# Patient Record
Sex: Male | Born: 1985 | Race: Black or African American | Hispanic: No | Marital: Married | Smoking: Never smoker
Health system: Southern US, Community
[De-identification: ages and names within clinical notes are randomized; demographics above are authoritative.]

---

## 2020-11-18 ENCOUNTER — Other Ambulatory Visit: Payer: Self-pay

## 2020-11-18 ENCOUNTER — Emergency Department
Admission: EM | Admit: 2020-11-18 | Discharge: 2020-11-19 | Disposition: A | Discharge status: Home / Self Care | Payer: Self-pay | Attending: Emergency Medicine | Admitting: Emergency Medicine

## 2020-11-18 ENCOUNTER — Encounter: Payer: Self-pay | Admitting: *Deleted

## 2020-11-18 DIAGNOSIS — N451 Epididymitis: Secondary | ICD-10-CM | POA: Insufficient documentation

## 2020-11-18 DIAGNOSIS — A549 Gonococcal infection, unspecified: Secondary | ICD-10-CM | POA: Insufficient documentation

## 2020-11-18 LAB — URINALYSIS, COMPLETE (UACMP) WITH MICROSCOPIC
Bacteria, UA: NONE SEEN
Bilirubin Urine: NEGATIVE
Glucose, UA: NEGATIVE mg/dL
Ketones, ur: NEGATIVE mg/dL
Nitrite: NEGATIVE
Protein, ur: 100 mg/dL — AB
RBC / HPF: >50 RBC/hpf — ABNORMAL HIGH (ref 0–5)
Specific Gravity, Urine: 1.02 (ref 1.005–1.030)
Squamous Epithelial / HPF: NONE SEEN (ref 0–5)
WBC, UA: >50 WBC/hpf — ABNORMAL HIGH (ref 0–5)
pH: 5 (ref 5.0–8.0)

## 2020-11-18 LAB — CHLAMYDIA/NGC RT PCR (ARMC ONLY)
Chlamydia Tr: NOT DETECTED
N gonorrhoeae: DETECTED — AB

## 2020-11-18 MED ORDER — CEFTRIAXONE SODIUM 1 G IJ SOLR
500.0000 mg | Freq: Once | INTRAMUSCULAR | Status: AC
  Administered 2020-11-19: 500 mg via INTRAMUSCULAR
  Filled 2020-11-18: qty 10

## 2020-11-18 NOTE — ED Provider Notes (Signed)
[facility] Emergency Department Provider Note  ____________________________________________   Event Date/Time   First MD Initiated Contact with Patient 11/18/20 2341     (approximate)  I have reviewed the triage vital signs and the nursing notes.   HISTORY  Chief Complaint Penile Discharge    HPI Eduardo Stephens is a 35 y.o. male with several days of penile discharge, dysuria, scrotal swelling and pain.  Sexually active with 1 male partner for the past 7 months.  Has had a history of STDs years ago.  No fevers, abdominal pain, nausea, vomiting or diarrhea.        History reviewed. No pertinent past medical history.  There are no problems to display for this patient.   History reviewed. No pertinent surgical history.  Prior to Admission medications   Medication Sig Start Date End Date Taking? Authorizing Provider  levofloxacin (LEVAQUIN) 500 MG tablet Take 1 tablet (500 mg total) by mouth daily for 10 days. 11/19/20 11/29/20 Yes Shiza Thelen, Layla Maw, DO    Allergies Patient has no known allergies.  History reviewed. No pertinent family history.  Social History Social History   Tobacco Use   Smoking status: Never   Smokeless tobacco: Never  Substance Use Topics   Alcohol use: Yes   Drug use: Not Currently    Review of Systems Constitutional: No fever. Eyes: No visual changes. ENT: No sore throat. Cardiovascular: Denies chest pain. Respiratory: Denies shortness of breath. Gastrointestinal: No nausea, vomiting, diarrhea. Genitourinary: + for dysuria. Musculoskeletal: Negative for back pain. Skin: Negative for rash. Neurological: Negative for focal weakness or numbness.  ____________________________________________   PHYSICAL EXAM:  VITAL SIGNS: ED Triage Vitals  Enc Vitals Group     BP 11/18/20 2021 (!) 146/88     Pulse Rate 11/18/20 2021 (!) 111     Resp 11/18/20 2021 18     Temp 11/18/20 2021 99.2 F (37.3 C)     Temp  Source 11/18/20 2021 Oral     SpO2 11/18/20 2021 96 %     Weight 11/18/20 2022 220 lb (99.8 kg)     Height 11/18/20 2022 6' (1.829 m)     Head Circumference --      Peak Flow --      Pain Score 11/18/20 2022 5     Pain Loc --      Pain Edu? --      Excl. in GC? --    CONSTITUTIONAL: Alert and oriented and responds appropriately to questions. Well-appearing; well-nourished HEAD: Normocephalic EYES: Conjunctivae clear, pupils appear equal, EOM appear intact ENT: normal nose; moist mucous membranes NECK: Supple, normal ROM CARD: Regular and tachycardic; S1 and S2 appreciated; no murmurs, no clicks, no rubs, no gallops RESP: Normal chest excursion without splinting or tachypnea; breath sounds clear and equal bilaterally; no wheezes, no rhonchi, no rales, no hypoxia or respiratory distress, speaking full sentences ABD/GI: Normal bowel sounds; non-distended; soft, non-tender, no rebound, no guarding, no peritoneal signs, no hepatosplenomegaly, multiple old surgical abdominal scars GU:  Normal external genitalia, circumcised male, normal penile shaft, no blood or discharge at the urethral meatus, no testicular masses but is tender to palpation bilaterally with swelling noted on the left side compared to the right, 2+ femoral pulses bilaterally; no perineal erythema, warmth, subcutaneous air or crepitus; no high riding testicle, normal bilateral cremasteric reflex.  Chaperone present for exam. BACK: The back appears normal EXT: Normal ROM in all joints; no deformity noted, no edema; no  cyanosis SKIN: Normal color for age and race; warm; no rash on exposed skin NEURO: Moves all extremities equally PSYCH: The patient's mood and manner are appropriate.  ____________________________________________   LABS (all labs ordered are listed, but only abnormal results are displayed)  Labs Reviewed  CHLAMYDIA/NGC RT PCR ([facility] ONLY)           - Abnormal; Notable for the following components:      Result  Value   N gonorrhoeae DETECTED (*)    All other components within normal limits  URINALYSIS, COMPLETE (UACMP) WITH MICROSCOPIC - Abnormal; Notable for the following components:   Color, Urine AMBER (*)    APPearance CLOUDY (*)    Hgb urine dipstick MODERATE (*)    Protein, ur 100 (*)    Leukocytes,Ua LARGE (*)    RBC / HPF >50 (*)    WBC, UA >50 (*)    All other components within normal limits  HIV ANTIBODY (ROUTINE TESTING W REFLEX)  RPR   ____________________________________________  EKG   ____________________________________________  RADIOLOGY I, Harika Laidlaw, personally viewed and evaluated these images (plain radiographs) as part of my medical decision making, as well as reviewing the written report by the radiologist.  ED MD interpretation: Ultrasound shows epididymitis.  Normal blood flow to both testicles.  Official radiology report(s): US SCROTUM W/DOPPLER  Result Date: 11/19/2020 CLINICAL DATA:  Scrotal swelling, testicular pain x3 days EXAM: SCROTAL ULTRASOUND DOPPLER ULTRASOUND OF THE TESTICLES TECHNIQUE: Complete ultrasound examination of the testicles, epididymis, and other scrotal structures was performed. Color and spectral Doppler ultrasound were also utilized to evaluate blood flow to the testicles. COMPARISON:  None. FINDINGS: Right testicle Measurements: 5.1 x 2.0 x 2.3 cm. No mass or microlithiasis visualized. Left testicle Measurements: 3.6 x 2.0 x 3.0 cm. No mass or microlithiasis visualized. Right epididymis:  Normal in size and appearance. Left epididymis:  Enlarged/hypervascular. Hydrocele:  Small left hydrocele. Varicocele:  Present on the left. Pulsed Doppler interrogation of both testes demonstrates normal low resistance arterial and venous waveforms bilaterally. IMPRESSION: Enlarged/hypervascular left epididymis, suggesting epididymitis. Reactive left hydrocele. Suspected left varicocele. Normal sonographic appearance of the bilateral testes. No evidence of  testicular torsion. Electronically Signed   By: Charline Bills M.D.   On: 11/19/2020 01:25    ____________________________________________   PROCEDURES  Procedure(s) performed (including Critical Care):  Procedures    ____________________________________________   INITIAL IMPRESSION / ASSESSMENT AND PLAN / ED COURSE  As part of my medical decision making, I reviewed the following data within the electronic MEDICAL RECORD NUMBER Nursing notes reviewed and incorporated, Labs reviewed , Radiograph reviewed , and Notes from prior ED visits         Patient here with dysuria, scrotal pain and swelling.  Urine shows large amount of white blood cells, red blood cells.  Positive for gonorrhea.  Will give Rocephin here.  He agrees to HIV, syphilis testing today.  Will obtain scrotal ultrasound as differential includes epididymitis, torsion, mass.  No sign of scrotal abscess, cellulitis, Fournier's gangrene.  Doubt kidney stone, appendicitis.  Patient declines pain medication at this time.  ED PROGRESS  Ultrasound shows epididymitis.  Normal blood flow to both testicles.  Has received 500 mg of IM Rocephin here.  We will also discharge with Levaquin for the next 10 days.  Recommended alternating Tylenol, Motrin for pain.  Recommended that he avoid any sexual intercourse until he has completed his treatment and his partner has been 7 days past treatment as well.  Patient comfortable with this plan.  Will discharge home.   At this time, I do not feel there is any life-threatening condition present. I have reviewed, interpreted and discussed all results (EKG, imaging, lab, urine as appropriate) and exam findings with patient/family. I have reviewed nursing notes and appropriate previous records.  I feel the patient is safe to be discharged home without further emergent workup and can continue workup as an outpatient as needed. Discussed usual and customary return precautions. Patient/family verbalize  understanding and are comfortable with this plan.  Outpatient follow-up has been provided as needed. All questions have been answered.  ____________________________________________   FINAL CLINICAL IMPRESSION(S) / ED DIAGNOSES  Final diagnoses:  Gonorrhea  Epididymitis     ED Discharge Orders          Ordered    levofloxacin (LEVAQUIN) 500 MG tablet  Daily        11/19/20 0144            *Please note:  Eduardo Stephens was evaluated in Emergency Department on 11/19/2020 for the symptoms described in the history of present illness. He was evaluated in the context of the global COVID-19 pandemic, which necessitated consideration that the patient might be at risk for infection with the SARS-CoV-2 virus that causes COVID-19. Institutional protocols and algorithms that pertain to the evaluation of patients at risk for COVID-19 are in a state of rapid change based on information released by regulatory bodies including the CDC and federal and state organizations. These policies and algorithms were followed during the patient's care in the ED.  Some ED evaluations and interventions may be delayed as a result of limited staffing during and the pandemic.*   Note:  This document was prepared using Dragon voice recognition software and may include unintentional dictation errors.    Roald Lukacs, Layla Maw, DO 11/19/20 [phone]

## 2020-11-18 NOTE — ED Triage Notes (Signed)
Pt has green penile discharge and burning with urination.  Pt also reports pain in testicles.  Pt alert.

## 2020-11-19 ENCOUNTER — Emergency Department: Payer: Self-pay

## 2020-11-19 LAB — RPR: RPR Ser Ql: NONREACTIVE

## 2020-11-19 LAB — HIV ANTIBODY (ROUTINE TESTING W REFLEX): HIV Screen 4th Generation wRfx: NONREACTIVE

## 2020-11-19 MED ORDER — LEVOFLOXACIN 500 MG PO TABS
500.0000 mg | ORAL_TABLET | Freq: Once | ORAL | Status: AC
  Administered 2020-11-19: 500 mg via ORAL
  Filled 2020-11-19: qty 1

## 2020-11-19 MED ORDER — LEVOFLOXACIN 500 MG PO TABS: 500.0000 mg | ORAL_TABLET | Freq: Every day | ORAL | 0 refills | Status: AC

## 2020-11-19 MED ORDER — IBUPROFEN 800 MG PO TABS
800.0000 mg | ORAL_TABLET | Freq: Once | ORAL | Status: AC
  Administered 2020-11-19: 800 mg via ORAL
  Filled 2020-11-19: qty 1

## 2020-11-19 NOTE — ED Notes (Signed)
ED Provider Ward at bedside. 

## 2020-11-19 NOTE — Discharge Instructions (Addendum)
You may alternate Tylenol 1000 mg every 6 hours as needed for pain, fever and Ibuprofen 800 mg every 8 hours as needed for pain, fever.  Please take Ibuprofen with food.  Do not take more than 4000 mg of Tylenol (acetaminophen) in a 24 hour period.  Steps to find a Primary Care Provider (PCP):  Call [phone] or [phone] to access "[facility] Find a Doctor Service."  2.  You may also go on the [facility] website at [email or web address]  Please avoid any sexual intercourse until you have completed your antibiotic therapy and at least 7 days after your partner has been treated as well.

## 2022-09-14 IMAGING — US US SCROTUM W/ DOPPLER COMPLETE
1 series · 14 of 25 positions shown · non-contrast
Comparison: None.

CLINICAL DATA: Scrotal swelling, testicular pain x3 days

EXAM:
SCROTAL ULTRASOUND
DOPPLER ULTRASOUND OF THE TESTICLES
TECHNIQUE: Complete ultrasound examination of the testicles, epididymis, and
other scrotal structures was performed. Color and spectral Doppler
ultrasound were also utilized to evaluate blood flow to the
testicles.

[Series 1: us scrotum w/doppler · 14 of 59 slices shown]
[im 1/59]
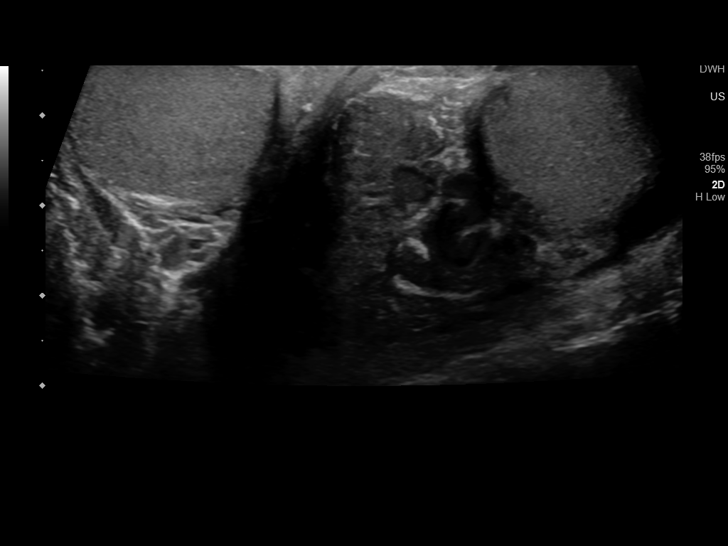
[im 5/59]
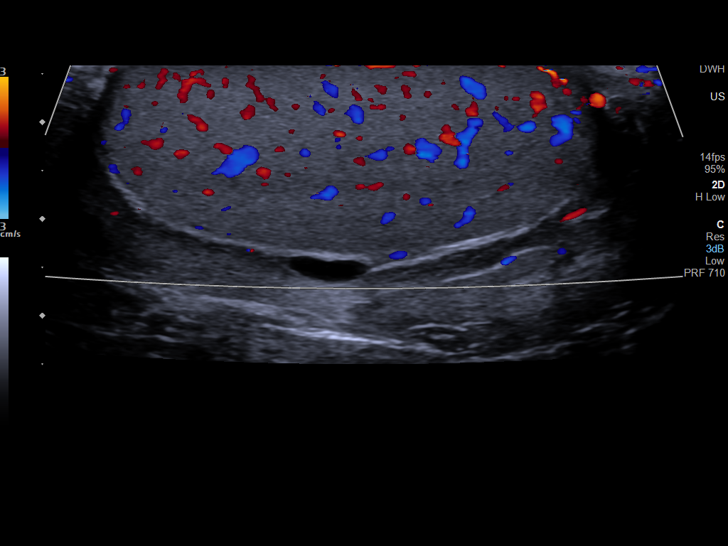
[im 10/59]
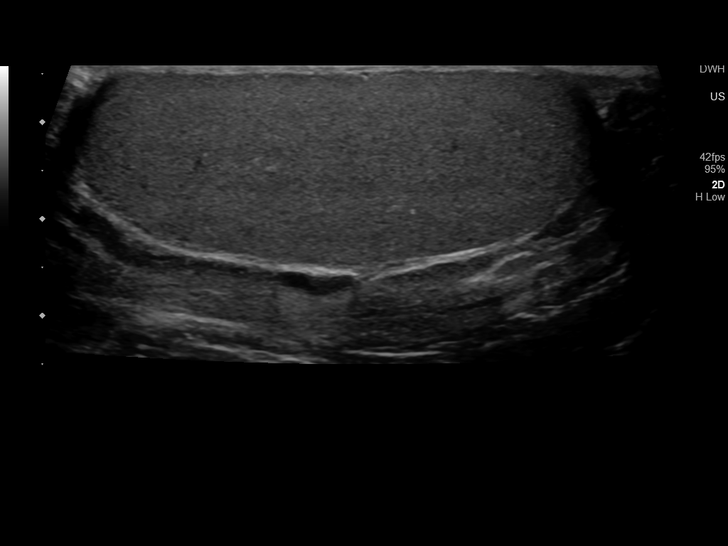
[im 15/59]
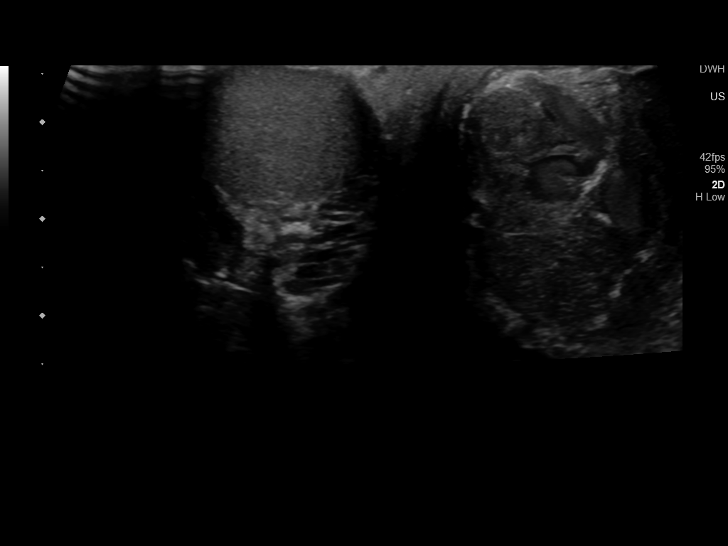
[im 20/59]
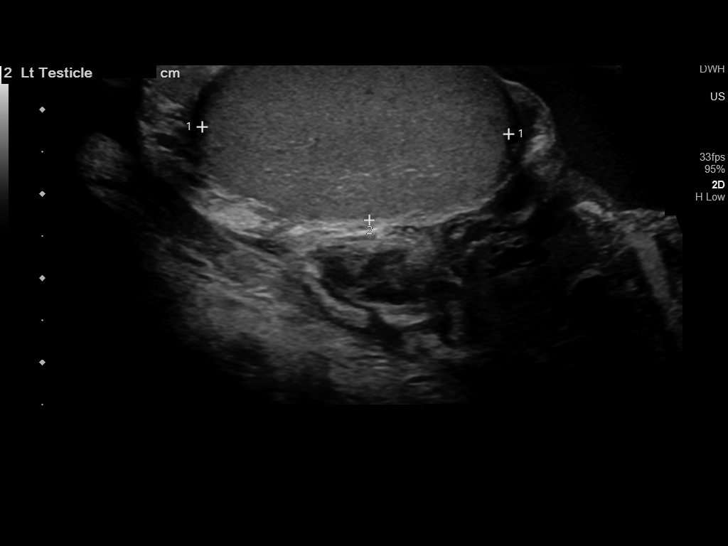
[im 22/59]
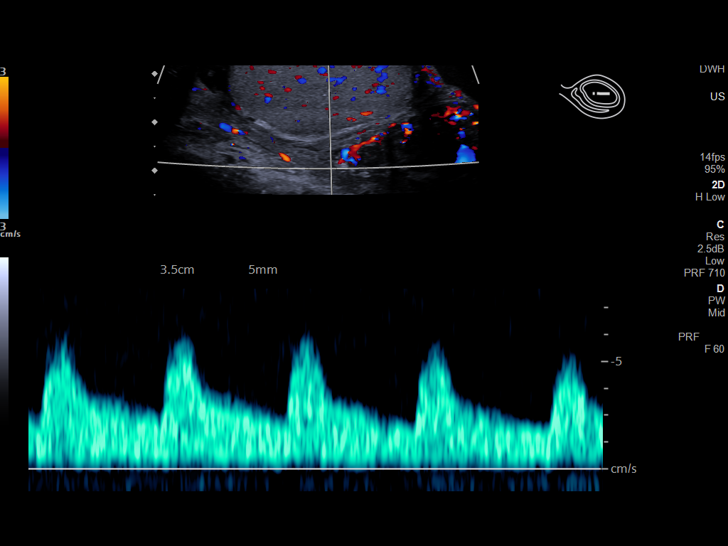
[im 27/59]
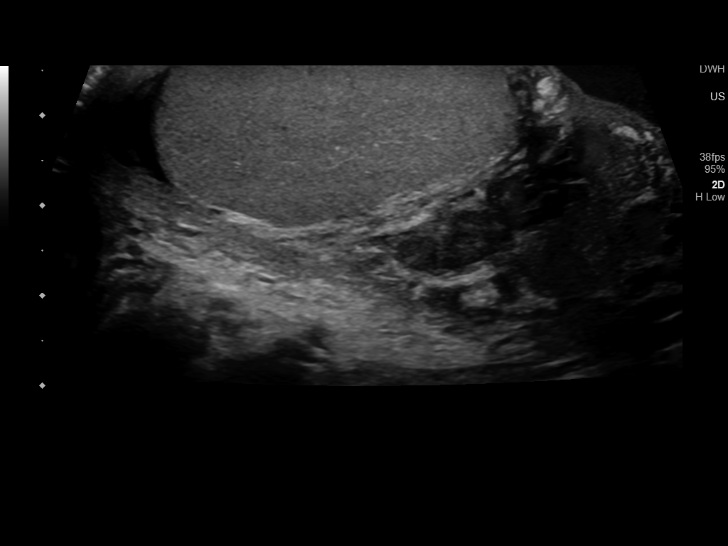
[im 32/59]
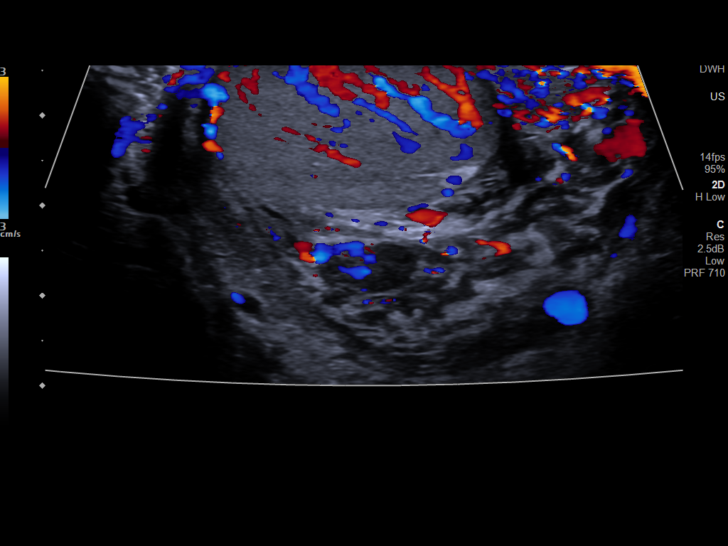
[im 37/59]
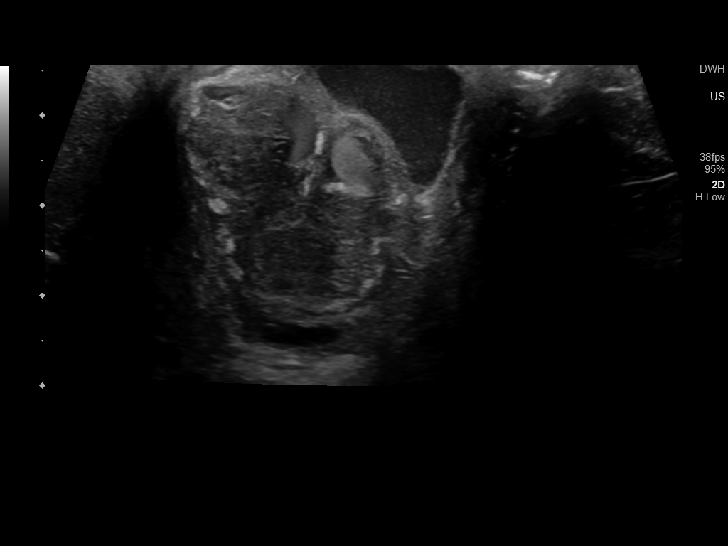
[im 39/59]
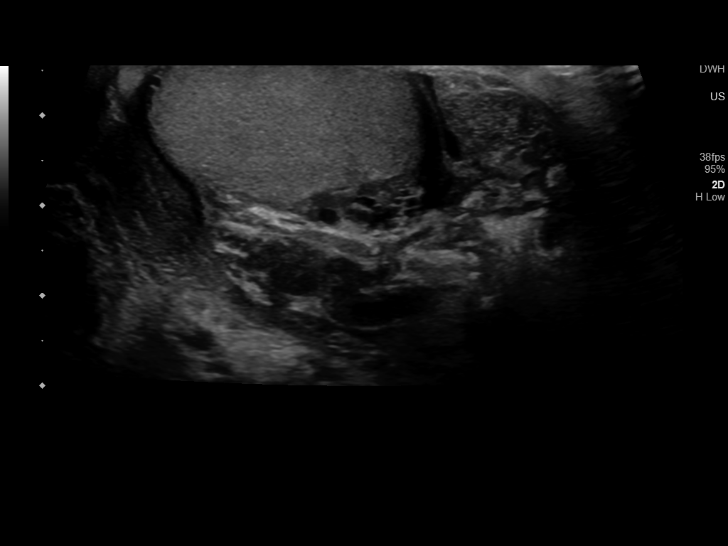
[im 44/59]
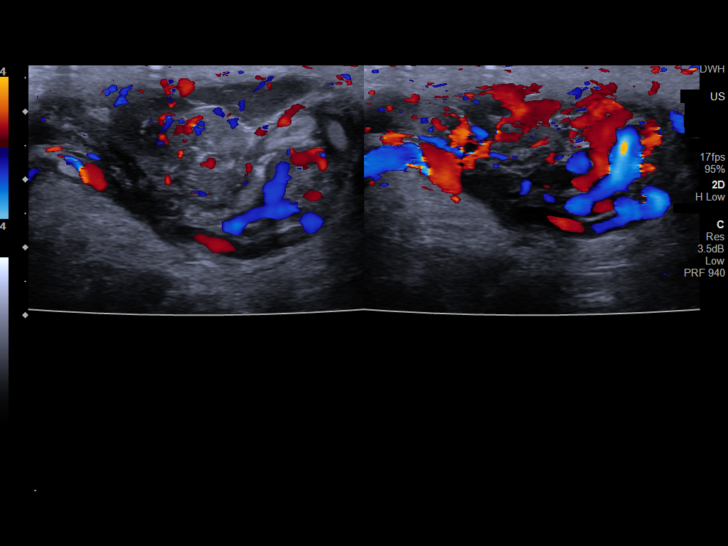
[im 49/59]
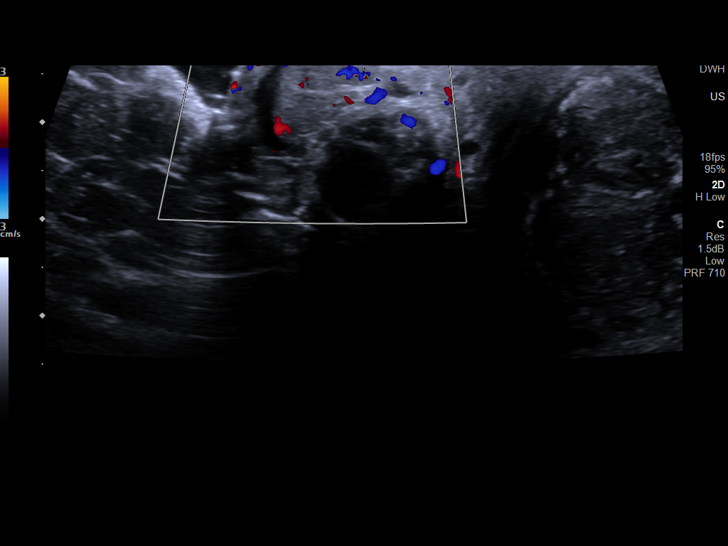
[im 54/59]
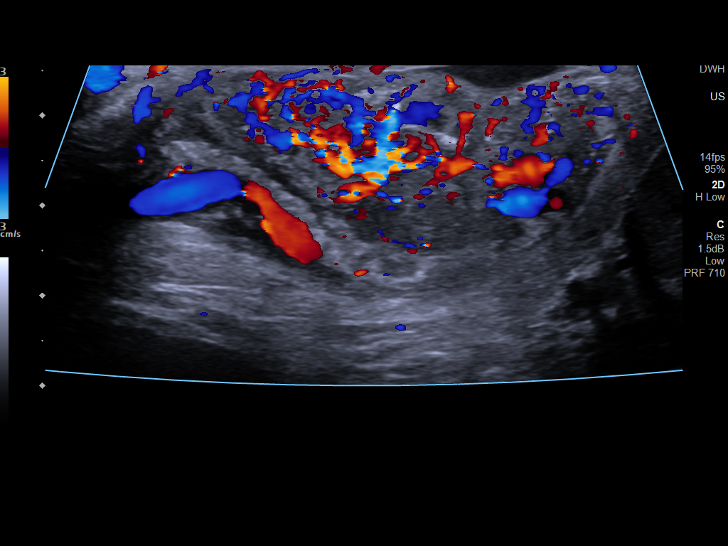
[im 59/59]
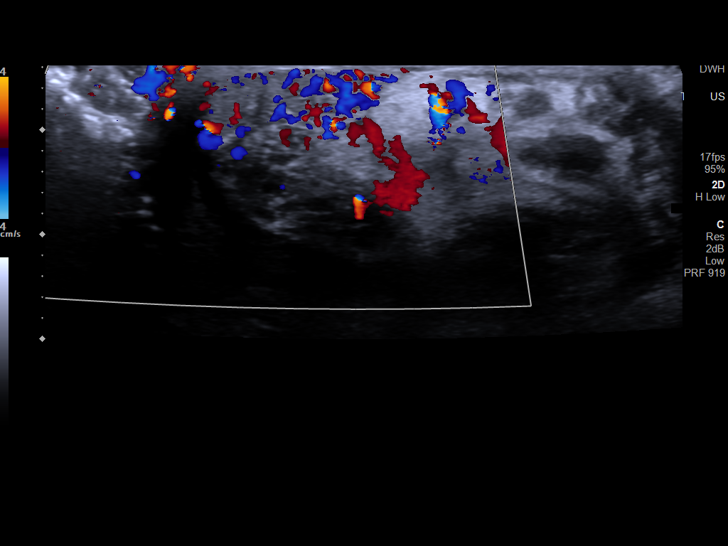

[14 of 25 positions shown; findings below may reference images not displayed]

FINDINGS: Right testicle

Measurements: 5.1 x 2.0 x 2.3 cm. No mass or microlithiasis
visualized.

Left testicle

Measurements: 3.6 x 2.0 x 3.0 cm. No mass or microlithiasis
visualized.

Right epididymis:  Normal in size and appearance.

Left epididymis:  Enlarged/hypervascular.

Hydrocele:  Small left hydrocele.

Varicocele:  Present on the left.

Pulsed Doppler interrogation of both testes demonstrates normal low
resistance arterial and venous waveforms bilaterally.
IMPRESSION: Enlarged/hypervascular left epididymis, suggesting epididymitis.
Reactive left hydrocele.

Suspected left varicocele.

Normal sonographic appearance of the bilateral testes. No evidence
of testicular torsion.
